# Patient Record
Sex: Female | Born: 2000 | Race: White | Hispanic: No | Marital: Single | Smoking: Never smoker
Health system: Southern US, Community
[De-identification: ages and names within clinical notes are randomized; demographics above are authoritative.]

## PROBLEM LIST (undated history)

## (undated) DIAGNOSIS — F909 Attention-deficit hyperactivity disorder, unspecified type: Secondary | ICD-10-CM

## (undated) DIAGNOSIS — F509 Eating disorder, unspecified: Secondary | ICD-10-CM

## (undated) DIAGNOSIS — R45851 Suicidal ideations: Secondary | ICD-10-CM

## (undated) DIAGNOSIS — F99 Mental disorder, not otherwise specified: Secondary | ICD-10-CM

## (undated) DIAGNOSIS — F32A Depression, unspecified: Secondary | ICD-10-CM

## (undated) DIAGNOSIS — F329 Major depressive disorder, single episode, unspecified: Secondary | ICD-10-CM

## (undated) DIAGNOSIS — F419 Anxiety disorder, unspecified: Secondary | ICD-10-CM

---
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
## 2020-07-16 ENCOUNTER — Encounter (HOSPITAL_COMMUNITY): Payer: Self-pay

## 2020-07-16 ENCOUNTER — Ambulatory Visit (HOSPITAL_COMMUNITY)
Admission: EM | Admit: 2020-07-16 | Discharge: 2020-07-16 | Disposition: A | Discharge status: Home / Self Care | Payer: Medicaid Other

## 2020-07-16 ENCOUNTER — Other Ambulatory Visit: Payer: Self-pay

## 2020-07-16 DIAGNOSIS — L0231 Cutaneous abscess of buttock: Secondary | ICD-10-CM

## 2020-07-16 MED ORDER — NAPROXEN 500 MG PO TABS: 500.0000 mg | ORAL_TABLET | Freq: Two times a day (BID) | ORAL | 0 refills | Status: DC

## 2020-07-16 MED ORDER — DOXYCYCLINE HYCLATE 100 MG PO CAPS: 100.0000 mg | ORAL_CAPSULE | Freq: Two times a day (BID) | ORAL | 0 refills | Status: AC

## 2020-07-16 NOTE — ED Provider Notes (Signed)
[facility]    CSN: [id number] Arrival date & time: 07/16/20  1550      History   Chief Complaint Chief Complaint  Patient presents with  . Abscess    HPI [name] is a 20 y.o. female.   Patient here with complaint of abscess to gluteal cleft that has been present for the past 2 days.  Patient reports pain has been worsening.  Denies any discharge or drainage.  Has not taken any OTC medications or treatments. Denies any specific alleviating or aggravating factors.  Denies any fevers, chest pain, shortness of breath, N/V/D, numbness, tingling, weakness, abdominal pain, or headaches.   ROS: As per HPI, all other pertinent ROS negative   The history is provided by the patient.  Abscess   Past Medical History:  Diagnosis Date  . ADHD (attention deficit hyperactivity disorder)   . Anxiety   . Depression   . Eating disorder   . Mental disorder   . Suicidal ideation     Patient Active Problem List   Diagnosis Date Noted  . ADHD (attention deficit hyperactivity disorder), combined type 12/14/2013  . Suicidal ideation 12/14/2013  . MDD (major depressive disorder), recurrent episode, severe (HCC) 12/14/2013  . Deliberate self-cutting 12/14/2013  . ODD (oppositional defiant disorder) 12/14/2013    History reviewed. No pertinent surgical history.  OB History   No obstetric history on file.      Home Medications    Prior to Admission medications   Medication Sig Start Date End Date Taking? Authorizing Provider  doxycycline (VIBRAMYCIN) 100 MG capsule Take 1 capsule (100 mg total) by mouth 2 (two) times daily for 10 days. 07/16/20 07/26/20 Yes Ivette Loyal, NP  naproxen (NAPROSYN) 500 MG tablet Take 1 tablet (500 mg total) by mouth 2 (two) times daily. 07/16/20  Yes Ivette Loyal, NP  traZODone (DESYREL) 50 MG tablet 1 tablet at bedtime as needed 02/15/16  Yes [provider]  cloNIDine (CATAPRES) 0.1 MG tablet Take 0.15 mg by mouth at  bedtime. 12/07/15   [provider]  escitalopram (LEXAPRO) 10 MG tablet Take 10 mg by mouth daily.    [provider]  FLUoxetine (PROZAC) 20 MG capsule Take 20 mg by mouth every morning. 07/01/20   [provider]  lisdexamfetamine (VYVANSE) 50 MG capsule Take 50 mg by mouth daily.    [provider]    Family History History reviewed. No pertinent family history.  Social History Social History   Tobacco Use  . Smoking status: Never Smoker  . Smokeless tobacco: Never Used  Substance Use Topics  . Alcohol use: No  . Drug use: No     Allergies   Patient has no known allergies.   Review of Systems Review of Systems  Skin: Positive for wound.  All other systems reviewed and are negative.    Physical Exam Triage Vital Signs ED Triage Vitals  Enc Vitals Group     BP 07/16/20 1615 112/78     Pulse Rate 07/16/20 1615 (!) 107     Resp 07/16/20 1615 18     Temp 07/16/20 1615 98.9 F (37.2 C)     Temp src --      SpO2 07/16/20 1615 98 %     Weight --      Height --      Head Circumference --      Peak Flow --      Pain Score 07/16/20 1613 8  Pain Loc --      Pain Edu? --      Excl. in GC? --    No data found.  Updated Vital Signs BP 112/78   Pulse (!) 107   Temp 98.9 F (37.2 C)   Resp 18   LMP 06/28/2020 (Approximate)   SpO2 98%   Visual Acuity Right Eye Distance:   Left Eye Distance:   Bilateral Distance:    Right Eye Near:   Left Eye Near:    Bilateral Near:     Physical Exam Vitals and nursing note reviewed.  Constitutional:      General: She is not in acute distress.    Appearance: Normal appearance. She is not ill-appearing, toxic-appearing or diaphoretic.  HENT:     Head: Normocephalic and atraumatic.  Eyes:     Conjunctiva/sclera: Conjunctivae normal.  Cardiovascular:     Rate and Rhythm: Normal rate.     Pulses: Normal pulses.  Pulmonary:     Effort: Pulmonary effort is normal.  Abdominal:      General: Abdomen is flat.  Musculoskeletal:        General: Normal range of motion.     Cervical back: Normal range of motion.  Skin:    General: Skin is warm and dry.     Findings: Abscess (fluctuant abscess to top of gluteal cleft) present.  Neurological:     General: No focal deficit present.     Mental Status: She is alert and oriented to person, place, and time.  Psychiatric:        Mood and Affect: Mood normal.      UC Treatments / Results  Labs (all labs ordered are listed, but only abnormal results are displayed) Labs Reviewed - No data to display  EKG   Radiology No results found.  Procedures Incision and Drainage  Date/Time: 07/16/2020 5:05 PM Performed by: Ivette Loyal, NP Authorized by: Ivette Loyal, NP   Consent:    Consent obtained:  Verbal   Consent given by:  Patient   Risks, benefits, and alternatives were discussed: yes     Risks discussed:  Bleeding, incomplete drainage, pain and infection   Alternatives discussed:  No treatment and alternative treatment Universal protocol:    Patient identity confirmed:  Verbally with patient and arm band Location:    Type:  Abscess   Location:  Anogenital   Anogenital location:  Gluteal cleft Pre-procedure details:    Skin preparation:  Antiseptic wash Procedure type:    Complexity:  Simple Procedure details:    Incision types:  Single straight   Incision depth:  Dermal   Wound management:  Probed and deloculated   Drainage:  Purulent   Drainage amount:  Moderate   Wound treatment:  Wound left open   Packing materials:  None Post-procedure details:    Procedure completion:  Tolerated well, no immediate complications   (including critical care time)  Medications Ordered in UC Medications - No data to display  Initial Impression / Assessment and Plan / UC Course  I have reviewed the triage vital signs and the nursing notes.  Pertinent labs & imaging results that were available during my care  of the patient were reviewed by me and considered in my medical decision making (see chart for details).     Assessment negative for red flags or concerns.  Abscess I&D as described above.  Wound cleaned and dressing applied.  Doxycycline twice daily for the next 10 days  for infection coverage.  Apply a warm compress at least 3-4 times a day to encourage drainage.  Clean wound with warm soapy water and apply new dressing at least twice a day.  Patient may take naproxen as needed for pain.  Discussed signs and symptoms of infection and encouraged to return or go to the emergency room for reevaluation if she develops any of those signs.  Follow-up with primary care as needed.  Final Clinical Impressions(s) / UC Diagnoses   Final diagnoses:  Abscess of buttock, right     Discharge Instructions     Take doxycycline 1 pill twice a day for the next 10 days.  Apply warm compress 3-4 times a day.  Clean the wound with warm soapy water and apply a new dressing at least twice a day.  You can take naproxen twice a day as needed for pain.  If you develop any redness, swelling, worsening pain, fevers, or red streaks follow-up or go to the emergency room for reevaluation.    ED Prescriptions    Medication Sig Dispense Auth. Provider   doxycycline (VIBRAMYCIN) 100 MG capsule Take 1 capsule (100 mg total) by mouth 2 (two) times daily for 10 days. 20 capsule Ivette Loyal, NP   naproxen (NAPROSYN) 500 MG tablet Take 1 tablet (500 mg total) by mouth 2 (two) times daily. 30 tablet Ivette Loyal, NP     PDMP not reviewed this encounter.   Ivette Loyal, NP 07/16/20 [phone]

## 2020-07-16 NOTE — Discharge Instructions (Addendum)
Take doxycycline 1 pill twice a day for the next 10 days.  Apply warm compress 3-4 times a day.  Clean the wound with warm soapy water and apply a new dressing at least twice a day.  You can take naproxen twice a day as needed for pain.  If you develop any redness, swelling, worsening pain, fevers, or red streaks follow-up or go to the emergency room for reevaluation.

## 2020-07-16 NOTE — ED Triage Notes (Signed)
Pt in with c/o abscess in between her buttocks that has been worsening over the last 2 days  Denies any drainage

## 2020-07-18 ENCOUNTER — Encounter (HOSPITAL_COMMUNITY): Payer: Self-pay | Admitting: Emergency Medicine

## 2020-07-18 ENCOUNTER — Emergency Department (HOSPITAL_COMMUNITY)
Admission: EM | Admit: 2020-07-18 | Discharge: 2020-07-18 | Disposition: A | Discharge status: Home / Self Care | Payer: Medicaid Other | Attending: Student | Admitting: Student

## 2020-07-18 ENCOUNTER — Other Ambulatory Visit: Payer: Self-pay

## 2020-07-18 DIAGNOSIS — L0501 Pilonidal cyst with abscess: Secondary | ICD-10-CM | POA: Insufficient documentation

## 2020-07-18 DIAGNOSIS — R1110 Vomiting, unspecified: Secondary | ICD-10-CM | POA: Insufficient documentation

## 2020-07-18 DIAGNOSIS — Z5321 Procedure and treatment not carried out due to patient leaving prior to being seen by health care provider: Secondary | ICD-10-CM | POA: Diagnosis not present

## 2020-07-18 LAB — CBC
HCT: 42.9 % (ref 36.0–46.0)
Hemoglobin: 14.5 g/dL (ref 12.0–15.0)
MCH: 32.1 pg (ref 26.0–34.0)
MCHC: 33.8 g/dL (ref 30.0–36.0)
MCV: 94.9 fL (ref 80.0–100.0)
Platelets: 287 10*3/uL (ref 150–400)
RBC: 4.52 MIL/uL (ref 3.87–5.11)
RDW: 12.6 % (ref 11.5–15.5)
WBC: 13.2 10*3/uL — ABNORMAL HIGH (ref 4.0–10.5)
nRBC: 0 % (ref 0.0–0.2)

## 2020-07-18 LAB — COMPREHENSIVE METABOLIC PANEL
ALT: 14 U/L (ref 0–44)
AST: 18 U/L (ref 15–41)
Albumin: 4.2 g/dL (ref 3.5–5.0)
Alkaline Phosphatase: 89 U/L (ref 38–126)
Anion gap: 10 (ref 5–15)
BUN: 8 mg/dL (ref 6–20)
CO2: 27 mmol/L (ref 22–32)
Calcium: 9.6 mg/dL (ref 8.9–10.3)
Chloride: 101 mmol/L (ref 98–111)
Creatinine, Ser: 0.84 mg/dL (ref 0.44–1.00)
GFR, Estimated: >60 mL/min (ref >60)
Glucose, Bld: 105 mg/dL — ABNORMAL HIGH (ref 70–99)
Potassium: 3.6 mmol/L (ref 3.5–5.1)
Sodium: 138 mmol/L (ref 135–145)
Total Bilirubin: 0.7 mg/dL (ref 0.3–1.2)
Total Protein: 7.6 g/dL (ref 6.5–8.1)

## 2020-07-18 LAB — MAGNESIUM: Magnesium: 1.9 mg/dL (ref 1.7–2.4)

## 2020-07-18 NOTE — ED Triage Notes (Signed)
Emergency Medicine Provider Triage Evaluation Note  [name] , a 20 y.o. female  was evaluated in triage.  Pt complains of pilondial abscess. States she had it drained two days ago, still having severe pain. Has been vomiting due to antibiotic, therefore has not been taking it.Taken 3 doses. Prescribed doxy.   Review of Systems  Positive: Abscess, vomiting  Negative: Fever  Physical Exam  LMP 06/28/2020 (Approximate)  Gen:   Awake, no distress   HEENT:  Atraumatic  Resp:  Normal effort  Cardiac:  Tachy to 140 Abd:   Pilonidal abscess  MSK:   Moves extremities without difficulty  Neuro:  Speech clear   Medical Decision Making  Medically screening exam initiated at 4:05 PM.  Appropriate orders placed.  [name] was informed that the remainder of the evaluation will be completed by another provider, this initial triage assessment does not replace that evaluation, and the importance of remaining in the ED until their evaluation is complete.  Clinical Impression  Tachycardic to 140, will obtain ekg. No CP.   MSE was initiated and I personally evaluated the patient and placed orders (if any) at  4:11 PM on July 18, 2020.  The patient appears stable so that the remainder of the MSE may be completed by another provider.    Farrel Gordon, PA-C 07/18/20 [phone]

## 2020-07-18 NOTE — ED Triage Notes (Signed)
Pt arrives to ED with c/o abscess to buttocks. Was seen at [facility] yesterday who performed I&D. Pt started antibiotics last night but comes to ED for worsening pain.

## 2020-07-18 NOTE — ED Notes (Signed)
Patient states it hurts too much and is leaving

## 2020-11-09 ENCOUNTER — Emergency Department (HOSPITAL_BASED_OUTPATIENT_CLINIC_OR_DEPARTMENT_OTHER)
Admission: EM | Admit: 2020-11-09 | Discharge: 2020-11-09 | Disposition: A | Discharge status: Home / Self Care | Payer: Medicaid Other | Attending: Emergency Medicine | Admitting: Emergency Medicine

## 2020-11-09 ENCOUNTER — Other Ambulatory Visit: Payer: Self-pay

## 2020-11-09 ENCOUNTER — Encounter (HOSPITAL_BASED_OUTPATIENT_CLINIC_OR_DEPARTMENT_OTHER): Payer: Self-pay | Admitting: Emergency Medicine

## 2020-11-09 ENCOUNTER — Emergency Department (HOSPITAL_BASED_OUTPATIENT_CLINIC_OR_DEPARTMENT_OTHER): Payer: Medicaid Other

## 2020-11-09 DIAGNOSIS — F17210 Nicotine dependence, cigarettes, uncomplicated: Secondary | ICD-10-CM | POA: Insufficient documentation

## 2020-11-09 DIAGNOSIS — R112 Nausea with vomiting, unspecified: Secondary | ICD-10-CM | POA: Diagnosis present

## 2020-11-09 DIAGNOSIS — R109 Unspecified abdominal pain: Secondary | ICD-10-CM | POA: Insufficient documentation

## 2020-11-09 DIAGNOSIS — N12 Tubulo-interstitial nephritis, not specified as acute or chronic: Secondary | ICD-10-CM | POA: Diagnosis not present

## 2020-11-09 LAB — COMPREHENSIVE METABOLIC PANEL
ALT: 15 U/L (ref 0–44)
AST: 15 U/L (ref 15–41)
Albumin: 4.1 g/dL (ref 3.5–5.0)
Alkaline Phosphatase: 65 U/L (ref 38–126)
Anion gap: 11 (ref 5–15)
BUN: 7 mg/dL (ref 6–20)
CO2: 29 mmol/L (ref 22–32)
Calcium: 8.7 mg/dL — ABNORMAL LOW (ref 8.9–10.3)
Chloride: 95 mmol/L — ABNORMAL LOW (ref 98–111)
Creatinine, Ser: 0.82 mg/dL (ref 0.44–1.00)
GFR, Estimated: >60 mL/min (ref >60)
Glucose, Bld: 102 mg/dL — ABNORMAL HIGH (ref 70–99)
Potassium: 2.7 mmol/L — CL (ref 3.5–5.1)
Sodium: 135 mmol/L (ref 135–145)
Total Bilirubin: 0.8 mg/dL (ref 0.3–1.2)
Total Protein: 7.5 g/dL (ref 6.5–8.1)

## 2020-11-09 LAB — CBC
HCT: 35.2 % — ABNORMAL LOW (ref 36.0–46.0)
Hemoglobin: 12.2 g/dL (ref 12.0–15.0)
MCH: 32.1 pg (ref 26.0–34.0)
MCHC: 34.7 g/dL (ref 30.0–36.0)
MCV: 92.6 fL (ref 80.0–100.0)
Platelets: 242 10*3/uL (ref 150–400)
RBC: 3.8 MIL/uL — ABNORMAL LOW (ref 3.87–5.11)
RDW: 12.5 % (ref 11.5–15.5)
WBC: 13.2 10*3/uL — ABNORMAL HIGH (ref 4.0–10.5)
nRBC: 0 % (ref 0.0–0.2)

## 2020-11-09 LAB — LIPASE, BLOOD: Lipase: <10 U/L — ABNORMAL LOW (ref 11–51)

## 2020-11-09 LAB — URINALYSIS, ROUTINE W REFLEX MICROSCOPIC
Bilirubin Urine: NEGATIVE
Glucose, UA: NEGATIVE mg/dL
Ketones, ur: NEGATIVE mg/dL
Nitrite: NEGATIVE
Protein, ur: 100 mg/dL — AB
Specific Gravity, Urine: 1.019 (ref 1.005–1.030)
pH: 6 (ref 5.0–8.0)

## 2020-11-09 LAB — PREGNANCY, URINE: Preg Test, Ur: NEGATIVE

## 2020-11-09 LAB — LACTIC ACID, PLASMA: Lactic Acid, Venous: 0.8 mmol/L (ref 0.5–1.9)

## 2020-11-09 MED ORDER — SODIUM CHLORIDE 0.9 % IV SOLN
2.0000 g | Freq: Once | INTRAVENOUS | Status: AC
  Administered 2020-11-09: 2 g via INTRAVENOUS
  Filled 2020-11-09: qty 20

## 2020-11-09 MED ORDER — POTASSIUM CHLORIDE CRYS ER 20 MEQ PO TBCR
40.0000 meq | EXTENDED_RELEASE_TABLET | Freq: Once | ORAL | Status: AC
  Administered 2020-11-09: 40 meq via ORAL
  Filled 2020-11-09: qty 2

## 2020-11-09 MED ORDER — LACTATED RINGERS IV BOLUS
1000.0000 mL | Freq: Once | INTRAVENOUS | Status: AC
  Administered 2020-11-09: 1000 mL via INTRAVENOUS

## 2020-11-09 MED ORDER — ONDANSETRON 4 MG PO TBDP: 4.0000 mg | ORAL_TABLET | ORAL | 0 refills | Status: AC | PRN

## 2020-11-09 MED ORDER — IOHEXOL 350 MG/ML SOLN
80.0000 mL | Freq: Once | INTRAVENOUS | Status: AC | PRN
  Administered 2020-11-09: 80 mL via INTRAVENOUS

## 2020-11-09 MED ORDER — IBUPROFEN 600 MG PO TABS: 600.0000 mg | ORAL_TABLET | Freq: Four times a day (QID) | ORAL | 0 refills | Status: AC | PRN

## 2020-11-09 MED ORDER — POTASSIUM CHLORIDE CRYS ER 20 MEQ PO TBCR: 20.0000 meq | EXTENDED_RELEASE_TABLET | Freq: Two times a day (BID) | ORAL | 0 refills | Status: AC

## 2020-11-09 MED ORDER — AMOXICILLIN-POT CLAVULANATE 875-125 MG PO TABS: 1.0000 | ORAL_TABLET | Freq: Two times a day (BID) | ORAL | 0 refills | Status: AC

## 2020-11-09 NOTE — Discharge Instructions (Addendum)
1.  You have been given an IV dose of Rocephin in the emergency department.  You do not need to start your antibiotics until tomorrow night (8\12 at around 10 PM).  You have been given a prescription for Zofran to take for nausea.  Take this as needed.  And continue to sip on fluids for the next 24 hours.  You can start to eat light food as tolerated when you are taking fluids.  Take extra strength Tylenol every 6 hours for fever control and body aches and pain.  You may also take ibuprofen 600 mg every 8 hours for additional pain and fever control. 2.  Schedule an appointment with your doctor for recheck within the next 3 to 5 days. 3.  Return to the emergency department if vomiting has not improved and you are not tolerating fluids within the next 12 hours or you have other worsening symptoms.

## 2020-11-09 NOTE — ED Triage Notes (Signed)
Left and middle abd pain x 1 week , hurts to cough and yawn,  has freq voiding x week also  nausea no diarrhea

## 2020-11-09 NOTE — ED Provider Notes (Signed)
[facility] EMERGENCY DEPT Provider Note   CSN: [id number] Arrival date & time: 11/09/20  1622     History Chief Complaint  Patient presents with   Abdominal Pain    [name] is a 20 y.o. female.  HPI Patient reports that symptoms started with vomiting 6 days ago.  Both the patient and her mother got acute onset of vomiting illness after eating together.  They assumed it was food poisoning.  Patient's mother reports she was better within 2 days.  Neither of them went on to have any diarrhea.  The patient however continued to have a lot of nausea and intermittent vomiting.  She reports her abdomen started to get very uncomfortable and painful.  She reports now she has pain in her left mid abdomen and urinary frequency.  She has had a fever at home.  She is currently on her menstrual cycle.  Patient is sexually active.  Patient has not had any vaginal drainage or discharge or lower abdominal pain.    Past Medical History:  Diagnosis Date   ADHD (attention deficit hyperactivity disorder)    Anxiety    Depression    Eating disorder    Mental disorder    Suicidal ideation     Patient Active Problem List   Diagnosis Date Noted   ADHD (attention deficit hyperactivity disorder), combined type 12/14/2013   Suicidal ideation 12/14/2013   MDD (major depressive disorder), recurrent episode, severe (HCC) 12/14/2013   Deliberate self-cutting 12/14/2013   ODD (oppositional defiant disorder) 12/14/2013    History reviewed. No pertinent surgical history.   OB History   No obstetric history on file.     No family history on file.  Social History   Tobacco Use   Smoking status: Every Day    Types: Cigarettes   Smokeless tobacco: Current  Vaping Use   Vaping Use: Every day  Substance Use Topics   Alcohol use: Yes   Drug use: Yes    Home Medications Prior to Admission medications   Medication Sig Start Date End Date Taking? Authorizing Provider   amoxicillin-clavulanate (AUGMENTIN) 875-125 MG tablet Take 1 tablet by mouth 2 (two) times daily. One po bid x 7 days 11/09/20  Yes Angeli Demilio, Lebron Conners, MD  ibuprofen (ADVIL) 600 MG tablet Take 1 tablet (600 mg total) by mouth every 6 (six) hours as needed. 11/09/20  Yes Arby Barrette, MD  ondansetron (ZOFRAN ODT) 4 MG disintegrating tablet Take 1 tablet (4 mg total) by mouth every 4 (four) hours as needed for nausea or vomiting. 11/09/20  Yes Bina Veenstra, Lebron Conners, MD  cloNIDine (CATAPRES) 0.1 MG tablet Take 0.15 mg by mouth at bedtime. 12/07/15   [provider]  escitalopram (LEXAPRO) 10 MG tablet Take 10 mg by mouth daily.    [provider]  FLUoxetine (PROZAC) 20 MG capsule Take 20 mg by mouth every morning. 07/01/20   [provider]  lisdexamfetamine (VYVANSE) 50 MG capsule Take 50 mg by mouth daily.    [provider]  naproxen (NAPROSYN) 500 MG tablet Take 1 tablet (500 mg total) by mouth 2 (two) times daily. 07/16/20   Ivette Loyal, NP  traZODone (DESYREL) 50 MG tablet 1 tablet at bedtime as needed 02/15/16   [provider]    Allergies    Patient has no known allergies.  Review of Systems   Review of Systems 10 systems reviewed and negative except as per HPI Physical Exam Updated Vital Signs BP (!) 105/59 (BP  Location: Right Arm)   Pulse 97   Temp 98.5 F (36.9 C)   Resp 20   Ht 5\' 2"  (1.575 m)   Wt 59.9 kg   LMP 10/26/2020   SpO2 99%   BMI 24.14 kg/m   Physical Exam Constitutional:      Comments: Patient appears mildly ill.  Mental status is clear.  No respiratory distress  HENT:     Mouth/Throat:     Pharynx: Oropharynx is clear.  Eyes:     Extraocular Movements: Extraocular movements intact.  Cardiovascular:     Rate and Rhythm: Normal rate and regular rhythm.  Pulmonary:     Effort: Pulmonary effort is normal.     Breath sounds: Normal breath sounds.  Abdominal:     Comments: Moderate mid left lateral quadrant pain to  palpation.  Lower abdomen nontender.  No right lower quadrant tenderness.  Musculoskeletal:        General: No swelling or tenderness. Normal range of motion.     Right lower leg: No edema.     Left lower leg: No edema.  Skin:    General: Skin is warm and dry.  Neurological:     General: No focal deficit present.     Mental Status: She is oriented to person, place, and time.     Coordination: Coordination normal.    ED Results / Procedures / Treatments   Labs (all labs ordered are listed, but only abnormal results are displayed) Labs Reviewed  LIPASE, BLOOD - Abnormal; Notable for the following components:      Result Value   Lipase <10 (*)    All other components within normal limits  COMPREHENSIVE METABOLIC PANEL - Abnormal; Notable for the following components:   Potassium 2.7 (*)    Chloride 95 (*)    Glucose, Bld 102 (*)    Calcium 8.7 (*)    All other components within normal limits  CBC - Abnormal; Notable for the following components:   WBC 13.2 (*)    RBC 3.80 (*)    HCT 35.2 (*)    All other components within normal limits  URINALYSIS, ROUTINE W REFLEX MICROSCOPIC - Abnormal; Notable for the following components:   Color, Urine BROWN (*)    APPearance CLOUDY (*)    Hgb urine dipstick LARGE (*)    Protein, ur 100 (*)    Leukocytes,Ua LARGE (*)    Bacteria, UA MANY (*)    All other components within normal limits  CULTURE, BLOOD (ROUTINE X 2)  CULTURE, BLOOD (ROUTINE X 2)  URINE CULTURE  PREGNANCY, URINE  LACTIC ACID, PLASMA  LACTIC ACID, PLASMA    EKG EKG Interpretation  Date/Time:  Thursday November 09 2020 18:04:42 EDT Ventricular Rate:  81 PR Interval:  165 QRS Duration: 84 QT Interval:  363 QTC Calculation: 422 R Axis:   76 Text Interpretation: Sinus rhythm Borderline T abnormalities, anterior leads rate slower but otherwise no sig change from previous Confirmed by Arby BarrettePfeiffer, Deicy Rusk [phone]([id number]) on 11/09/2020 9:46:18 PM  Radiology CT Abdomen Pelvis W  Contrast  Result Date: 11/09/2020 CLINICAL DATA:  Flank pain, stone disease suspected, left mid and abdominal flank pain for 1 week EXAM: CT ABDOMEN AND PELVIS WITH CONTRAST TECHNIQUE: Multidetector CT imaging of the abdomen and pelvis was performed using the standard protocol following bolus administration of intravenous contrast. CONTRAST:  80mL OMNIPAQUE IOHEXOL 350 MG/ML SOLN COMPARISON:  None. FINDINGS: Lower chest: Lung bases are clear. Normal heart size. No pericardial  effusion. Hepatobiliary: No worrisome focal liver lesions. Smooth liver surface contour. Normal hepatic attenuation. Normal gallbladder and biliary tree. Pancreas: No pancreatic ductal dilatation or surrounding inflammatory changes. Spleen: Normal in size. No concerning splenic lesions. Adrenals/Urinary Tract: Normal adrenals. Bilateral nephrographic striations, right greater than left. There is also more pronounced right perinephric and periureteral stranding albeit with bilateral urothelial thickening. No visible obstructing calculi. While the bladder itself is not of circumferentially thickened. No visible. No visible bladder calculi or layering debris. Stomach/Bowel: Distal esophagus, stomach and duodenal sweep are unremarkable. No small bowel wall thickening or dilatation. No evidence of obstruction. A normal appendix is visualized. No colonic dilatation or wall thickening. Vascular/Lymphatic: No significant vascular findings are present. No enlarged abdominal or pelvic lymph nodes. Reproductive: Anteverted uterus. Heterogeneous uterine enhancement is a normal physiologic finding related to contrast timing. Multiple follicles in both ovaries. Other: Small volume low-attenuation free fluid deep pelvis, nonspecific in a reproductive age female. Musculoskeletal: No acute osseous abnormality or suspicious osseous lesion. Dextrocurvature of the thoracolumbar spine, apex L2. IMPRESSION: Bilateral urothelial thickening most prominently about  the proximal ureters and renal collecting systems, right greater than left with bilateral nephrographic striations. Features are concerning for an ascending urinary tract infection with bilateral pyelonephritis. No obstructive urolithiasis or hydronephrosis. Electronically Signed   By: Kreg Shropshire M.D.   On: 11/09/2020 22:23    Procedures Procedures   Medications Ordered in ED Medications  potassium chloride SA (KLOR-CON) CR tablet 40 mEq (40 mEq Oral Given 11/09/20 1917)  lactated ringers bolus 1,000 mL (0 mLs Intravenous Stopped 11/09/20 2110)  cefTRIAXone (ROCEPHIN) 2 g in sodium chloride 0.9 % 100 mL IVPB (0 g Intravenous Stopped 11/09/20 2305)  iohexol (OMNIPAQUE) 350 MG/ML injection 80 mL (80 mLs Intravenous Contrast Given 11/09/20 2203)  potassium chloride SA (KLOR-CON) CR tablet 40 mEq (40 mEq Oral Given 11/09/20 2300)  lactated ringers bolus 1,000 mL (1,000 mLs Intravenous New Bag/Given 11/09/20 2300)    ED Course  I have reviewed the triage vital signs and the nursing notes.  Pertinent labs & imaging results that were available during my care of the patient were reviewed by me and considered in my medical decision making (see chart for details).    MDM Rules/Calculators/A&P                           Patient presents with a weeks worth of symptoms.  Urinalysis is positive for UTI.  CT scan does not show any evidence of retained kidney stone.  She has some symptoms of early pyelonephritis on CT.  Lactic acid negative.  Patient's mental status clear.  Patient has been rehydrated and given 2 g Rocephin.  Appearance is nontoxic.  At this time plan will be for continued home hydration with Zofran for the next 24 hours and then starting augmentin.  Return precautions reviewed. Final Clinical Impression(s) / ED Diagnoses Final diagnoses:  Pyelonephritis    Rx / DC Orders ED Discharge Orders          Ordered    amoxicillin-clavulanate (AUGMENTIN) 875-125 MG tablet  2 times daily         11/09/20 2324    ondansetron (ZOFRAN ODT) 4 MG disintegrating tablet  Every 4 hours PRN        11/09/20 2324    ibuprofen (ADVIL) 600 MG tablet  Every 6 hours PRN        11/09/20 2324  Arby Barrette, MD 11/09/20 2329

## 2020-11-12 LAB — URINE CULTURE: Culture: >=100000 — AB

## 2020-11-13 ENCOUNTER — Telehealth: Payer: Self-pay

## 2020-11-13 NOTE — Telephone Encounter (Signed)
Post ED Visit - Positive Culture Follow-up  Culture report reviewed by antimicrobial stewardship pharmacist: [facility] [x]  , Pharm.D. []  Audie Clear, Pharm.D., BCPS AQ-ID []  , Pharm.D., BCPS []  Celedonio Miyamoto, Pharm.D., BCPS []  [place], Garvin Fila.D., BCPS, AAHIVP []  , Pharm.D., BCPS, AAHIVP []  Georgina Pillion, PharmD, BCPS []  , PharmD, BCPS []  [place], PharmD, BCPS []  [address], PharmD []  , PharmD, BCPS []  Estella Husk, PharmD  Pharmacy Team []  Lysle Pearl, PharmD []  , PharmD []  Phillips Climes, PharmD []  , Rph []  Agapito Games) , PharmD []  Verlan Friends, PharmD []  , PharmD []  Mervyn Gay, PharmD []  , PharmD []  Vinnie Level, PharmD []  [facility], PharmD []  , PharmD []  Len Childs, PharmD   Positive urine culture Treated with Amoxicillin-Pot Clavulanate, organism sensitive to the same and no further patient follow-up is required at this time.  11/13/2020, 11:37 AM

## 2020-11-14 LAB — CULTURE, BLOOD (ROUTINE X 2)
Culture: NO GROWTH
Culture: NO GROWTH
Special Requests: ADEQUATE
Special Requests: ADEQUATE

## 2021-08-31 ENCOUNTER — Encounter (HOSPITAL_BASED_OUTPATIENT_CLINIC_OR_DEPARTMENT_OTHER): Payer: Self-pay | Admitting: *Deleted

## 2021-08-31 ENCOUNTER — Emergency Department (HOSPITAL_BASED_OUTPATIENT_CLINIC_OR_DEPARTMENT_OTHER): Payer: Medicaid Other

## 2021-08-31 ENCOUNTER — Emergency Department (HOSPITAL_BASED_OUTPATIENT_CLINIC_OR_DEPARTMENT_OTHER)
Admission: EM | Admit: 2021-08-31 | Discharge: 2021-08-31 | Disposition: A | Discharge status: Home / Self Care | Payer: Medicaid Other | Attending: Emergency Medicine | Admitting: Emergency Medicine

## 2021-08-31 ENCOUNTER — Other Ambulatory Visit: Payer: Self-pay

## 2021-08-31 DIAGNOSIS — R112 Nausea with vomiting, unspecified: Secondary | ICD-10-CM | POA: Diagnosis present

## 2021-08-31 DIAGNOSIS — R000 Tachycardia, unspecified: Secondary | ICD-10-CM | POA: Insufficient documentation

## 2021-08-31 DIAGNOSIS — R109 Unspecified abdominal pain: Secondary | ICD-10-CM

## 2021-08-31 DIAGNOSIS — N12 Tubulo-interstitial nephritis, not specified as acute or chronic: Secondary | ICD-10-CM | POA: Diagnosis not present

## 2021-08-31 LAB — URINALYSIS, ROUTINE W REFLEX MICROSCOPIC
Bilirubin Urine: NEGATIVE
Glucose, UA: NEGATIVE mg/dL
Nitrite: NEGATIVE
Protein, ur: 30 mg/dL — AB
Specific Gravity, Urine: 1.013 (ref 1.005–1.030)
pH: 6.5 (ref 5.0–8.0)

## 2021-08-31 LAB — PREGNANCY, URINE: Preg Test, Ur: NEGATIVE

## 2021-08-31 MED ORDER — FENTANYL CITRATE PF 50 MCG/ML IJ SOSY
50.0000 ug | PREFILLED_SYRINGE | Freq: Once | INTRAMUSCULAR | Status: AC
  Administered 2021-08-31: 50 ug via INTRAVENOUS
  Filled 2021-08-31: qty 1

## 2021-08-31 MED ORDER — SODIUM CHLORIDE 0.9 % IV BOLUS
1000.0000 mL | Freq: Once | INTRAVENOUS | Status: AC
  Administered 2021-08-31: 1000 mL via INTRAVENOUS

## 2021-08-31 MED ORDER — CIPROFLOXACIN HCL 500 MG PO TABS: 500.0000 mg | ORAL_TABLET | Freq: Two times a day (BID) | ORAL | 0 refills | Status: AC

## 2021-08-31 MED ORDER — PROMETHAZINE HCL 25 MG PO TABS: 25.0000 mg | ORAL_TABLET | Freq: Four times a day (QID) | ORAL | 0 refills | Status: AC | PRN

## 2021-08-31 MED ORDER — ONDANSETRON HCL 4 MG/2ML IJ SOLN
4.0000 mg | Freq: Once | INTRAMUSCULAR | Status: AC
  Administered 2021-08-31: 4 mg via INTRAVENOUS
  Filled 2021-08-31: qty 2

## 2021-08-31 NOTE — Discharge Instructions (Addendum)
You were seen today for a kidney infection.  I have prescribed an antibiotic to take care of the infection.  If you feel to improve or if conditions worsen, please seek further care, this would include if you become unable to tolerate oral fluids

## 2021-08-31 NOTE — ED Provider Notes (Signed)
[facility] EMERGENCY DEPT Provider Note   CSN: [id number] Arrival date & time: 08/31/21  1923     History  Chief Complaint  Patient presents with   Back Pain    [name] is a 21 y.o. female.  Patient presents with 2 to 3 days of low back pain with nausea and vomiting.  Patient denies urinary symptoms at this time.  Patient states that she had pyelonephritis in the past and this feels the same as that presentation.  The patient denies abdominal pain, chest pain, shortness of breath, dysuria.  Endorses back pain, nausea, vomiting.  Past medical history significant for history of pyelonephritis, depression, ADHD, anxiety, suicidal ideation  HPI     Home Medications Prior to Admission medications   Medication Sig Start Date End Date Taking? Authorizing Provider  ciprofloxacin (CIPRO) 500 MG tablet Take 1 tablet (500 mg total) by mouth every 12 (twelve) hours. 08/31/21  Yes Darrick Grinder, PA-C  promethazine (PHENERGAN) 25 MG tablet Take 1 tablet (25 mg total) by mouth every 6 (six) hours as needed for nausea or vomiting. 08/31/21  Yes Barrie Dunker B, PA-C  amoxicillin-clavulanate (AUGMENTIN) 875-125 MG tablet Take 1 tablet by mouth 2 (two) times daily. One po bid x 7 days 11/09/20   Arby Barrette, MD  cloNIDine (CATAPRES) 0.1 MG tablet Take 0.15 mg by mouth at bedtime. 12/07/15   [provider]  escitalopram (LEXAPRO) 10 MG tablet Take 10 mg by mouth daily.    [provider]  FLUoxetine (PROZAC) 20 MG capsule Take 20 mg by mouth every morning. 07/01/20   [provider]  ibuprofen (ADVIL) 600 MG tablet Take 1 tablet (600 mg total) by mouth every 6 (six) hours as needed. 11/09/20   Arby Barrette, MD  lisdexamfetamine (VYVANSE) 50 MG capsule Take 50 mg by mouth daily.    [provider]  naproxen (NAPROSYN) 500 MG tablet Take 1 tablet (500 mg total) by mouth 2 (two) times daily. 07/16/20   Ivette Loyal, NP  ondansetron (ZOFRAN  ODT) 4 MG disintegrating tablet Take 1 tablet (4 mg total) by mouth every 4 (four) hours as needed for nausea or vomiting. 11/09/20   Arby Barrette, MD  potassium chloride SA (KLOR-CON) 20 MEQ tablet Take 1 tablet (20 mEq total) by mouth 2 (two) times daily. 11/09/20   Arby Barrette, MD  traZODone (DESYREL) 50 MG tablet 1 tablet at bedtime as needed 02/15/16   [provider]      Allergies    Patient has no known allergies.    Review of Systems   Review of Systems  Respiratory:  Negative for shortness of breath.   Cardiovascular:  Negative for chest pain.  Gastrointestinal:  Positive for nausea and vomiting. Negative for abdominal pain.  Genitourinary:  Positive for flank pain.  Skin:  Negative for pallor.   Physical Exam Updated Vital Signs BP 115/76   Pulse 93   Temp 98.8 F (37.1 C)   Resp 18   Wt 59 kg   LMP 08/21/2021 (Approximate)   SpO2 97%   BMI 23.78 kg/m  Physical Exam Vitals and nursing note reviewed.  Constitutional:      Appearance: She is normal weight. She is ill-appearing.  HENT:     Head: Normocephalic and atraumatic.     Mouth/Throat:     Mouth: Mucous membranes are moist.  Eyes:     Conjunctiva/sclera: Conjunctivae normal.  Cardiovascular:     Rate and Rhythm: Regular  rhythm. Tachycardia present.     Pulses: Normal pulses.     Heart sounds: Normal heart sounds.  Pulmonary:     Effort: Pulmonary effort is normal.     Breath sounds: Normal breath sounds.  Abdominal:     Palpations: Abdomen is soft.     Tenderness: There is no abdominal tenderness. There is right CVA tenderness and left CVA tenderness.  Musculoskeletal:     Cervical back: Normal range of motion and neck supple.  Skin:    General: Skin is warm and dry.     Capillary Refill: Capillary refill takes less than 2 seconds.  Neurological:     Mental Status: She is alert.    ED Results / Procedures / Treatments   Labs (all labs ordered are listed, but only abnormal results  are displayed) Labs Reviewed  URINALYSIS, ROUTINE W REFLEX MICROSCOPIC - Abnormal; Notable for the following components:      Result Value   APPearance HAZY (*)    Hgb urine dipstick SMALL (*)    Ketones, ur TRACE (*)    Protein, ur 30 (*)    Leukocytes,Ua LARGE (*)    Bacteria, UA FEW (*)    All other components within normal limits  PREGNANCY, URINE    EKG None  Radiology CT Renal Stone Study  Result Date: 08/31/2021 CLINICAL DATA:  Lower back pain. EXAM: CT ABDOMEN AND PELVIS WITHOUT CONTRAST TECHNIQUE: Multidetector CT imaging of the abdomen and pelvis was performed following the standard protocol without IV contrast. RADIATION DOSE REDUCTION: This exam was performed according to the departmental dose-optimization program which includes automated exposure control, adjustment of the mA and/or kV according to patient size and/or use of iterative reconstruction technique. COMPARISON:  November 09, 2020 FINDINGS: Lower chest: No acute abnormality. Hepatobiliary: No focal liver abnormality is seen. No gallstones, gallbladder wall thickening, or biliary dilatation. Pancreas: Unremarkable. No pancreatic ductal dilatation or surrounding inflammatory changes. Spleen: Normal in size without focal abnormality. Adrenals/Urinary Tract: Adrenal glands are unremarkable. Kidneys are normal, without renal calculi, focal lesion, or hydronephrosis. Bladder is unremarkable. Stomach/Bowel: Stomach is within normal limits. Appendix appears normal. No evidence of bowel wall thickening, distention, or inflammatory changes. Vascular/Lymphatic: No significant vascular findings are present. No enlarged abdominal or pelvic lymph nodes. Reproductive: Uterus and bilateral adnexa are unremarkable. Other: No abdominal wall hernia or abnormality. A small amount of low-attenuation fluid (approximately 15.37 Hounsfield units) is again seen within the posterior aspect of the lower pelvis. This is along the regions posterior to the  uterus and bilateral adnexa and seen on the prior exam. Musculoskeletal: No acute or significant osseous findings. IMPRESSION: 1. Small amount of posterior pelvic fluid which is nonspecific in nature and stable in appearance when compared to the prior study. Electronically Signed   By: Aram Candelahaddeus  Houston M.D.   On: 08/31/2021 21:18    Procedures Procedures    Medications Ordered in ED Medications  ondansetron [facility](ZOFRAN) injection 4 mg (4 mg Intravenous Given 08/31/21 2128)  fentaNYL (SUBLIMAZE) injection 50 mcg (50 mcg Intravenous Given 08/31/21 2125)  sodium chloride 0.9 % bolus 1,000 mL (1,000 mLs Intravenous New Bag/Given 08/31/21 2127)    ED Course/ Medical Decision Making/ A&P                           Medical Decision Making Amount and/or Complexity of Data Reviewed Labs: ordered. Radiology: ordered.   This patient presents to the ED for concern of  back pain with nausea and vomiting, this involves an extensive number of treatment options, and is a complaint that carries with it a high risk of complications and morbidity.  The differential diagnosis includes pyelonephritis, nephrolithiasis, cystitis, and others   Co morbidities that complicate the patient evaluation  History of pyelonephritis   Additional history obtained:   External records from outside source obtained and reviewed including hospital records showing pyelonephritis and August 2022   Lab Tests:  I Ordered, and personally interpreted labs.  The pertinent results include: Urinalysis with hazy appearance, small hemoglobin on urine dipstick, trace ketones, 30 protein, large leukocytes, few bacteria, pregnancy urine negative   Imaging Studies ordered:  I ordered imaging studies including CT renal stone study I independently visualized and interpreted imaging which showed small amount of posterior pelvic fluid which is nonspecific in nature and stable in appearance when compared to the prior study I agree with the  radiologist interpretation   Problem List / ED Course / Critical interventions / Medication management   I ordered medication including Zofran for nausea, fentanyl for pain Reevaluation of the patient after these medicines showed that the patient improved I have reviewed the patients home medicines and have made adjustments as needed  Test / Admission - Considered:  Based on urinalysis and patient history, this seems likely to be a developing pyelonephritis.  We will treat the patient with Cipro.  I will also prescribe Phenergan as the patient states that she does not tolerate oral Zofran.  The patient has been given return precautions including the inability to tolerate p.o. fluids.        Final Clinical Impression(s) / ED Diagnoses Final diagnoses:  Nausea and vomiting, unspecified vomiting type  Flank pain  Pyelonephritis    Rx / DC Orders ED Discharge Orders          Ordered    ciprofloxacin (CIPRO) 500 MG tablet  Every 12 hours        08/31/21 2143    promethazine (PHENERGAN) 25 MG tablet  Every 6 hours PRN        08/31/21 2143              Pamala Duffel 08/31/21 2145    Terrilee Files, MD 09/01/21 [phone]

## 2021-08-31 NOTE — ED Notes (Signed)
Pt given urine cup and advised that urine sample is needed, pt voided pta and states that she needs a bit before she can void.

## 2021-08-31 NOTE — ED Triage Notes (Signed)
Pt is here for lower back pain and she feels that this feels the same as when she had a kidney infection in the past. Pt reports that this has been getting worse and initially began 3-4 days ago.

## 2022-11-17 IMAGING — CT CT RENAL STONE PROTOCOL
2 of 4 series · 16 of 46 positions shown, 18 images · non-contrast
Comparison: November 09, 2020

CLINICAL DATA: Lower back pain.



[Series 2: stone full · axial · 0.61mm/px · z∈[-668,-268]mm · 13 of 88 slices shown, 15 images]
[im 4/88  soft-tissue]
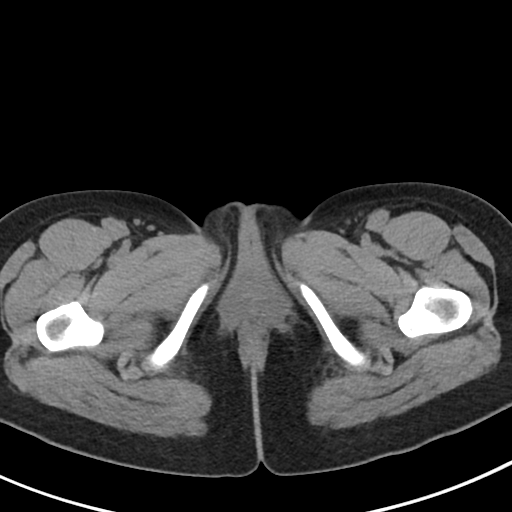
[im 4/88  bone]
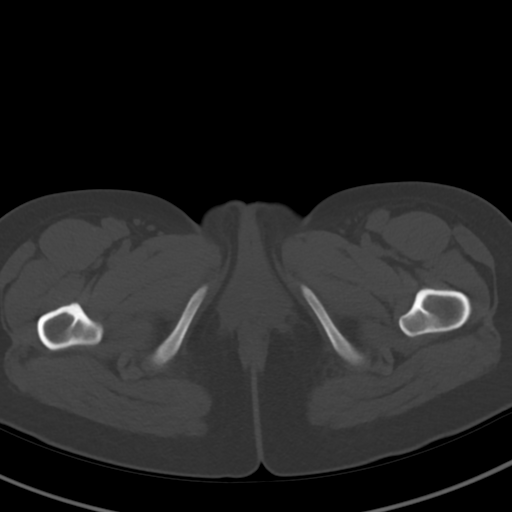
[im 11/88  soft-tissue]
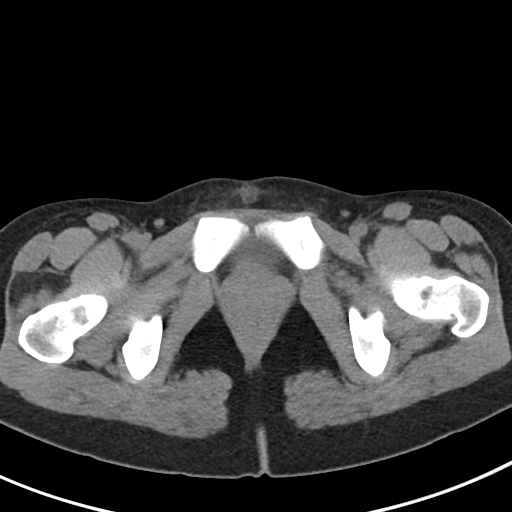
[im 18/88  soft-tissue]
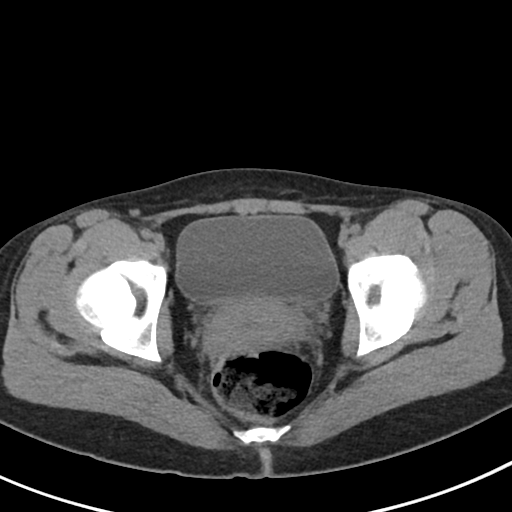
[im 25/88  soft-tissue]
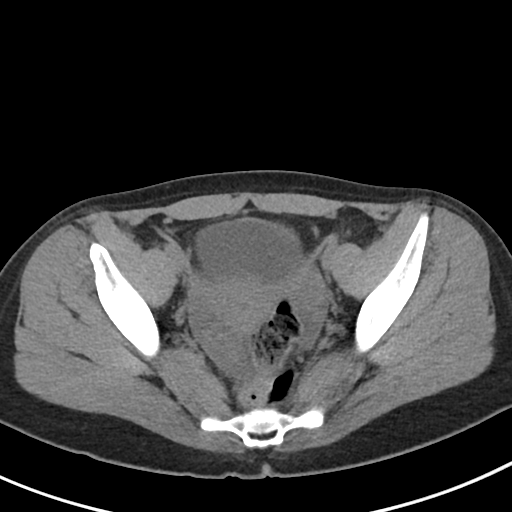
[im 32/88  soft-tissue]
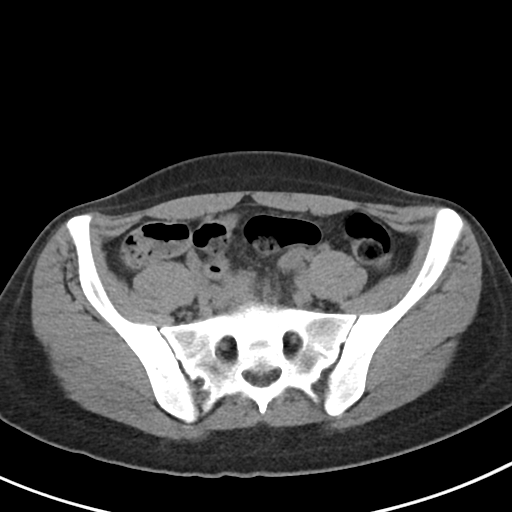
[im 39/88  soft-tissue]
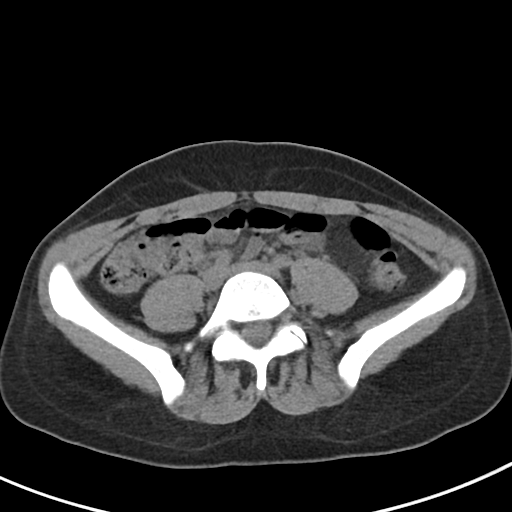
[im 46/88  soft-tissue]
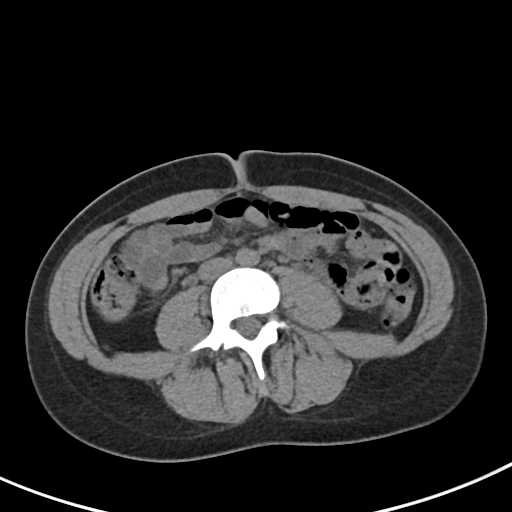
[im 49/88  soft-tissue]
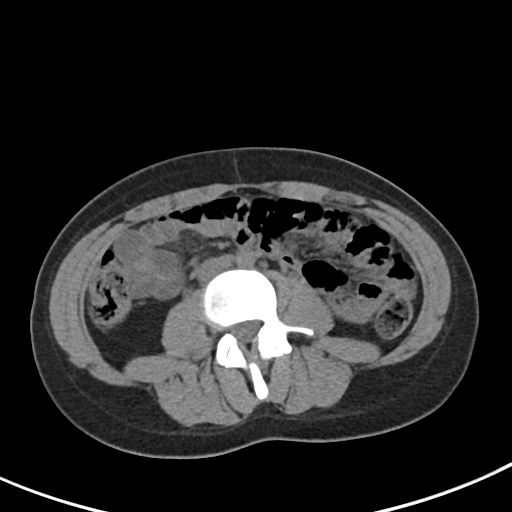
[im 56/88  soft-tissue]
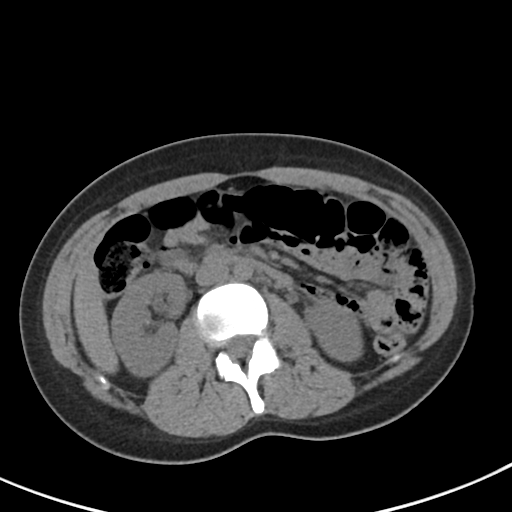
[im 56/88  bone]
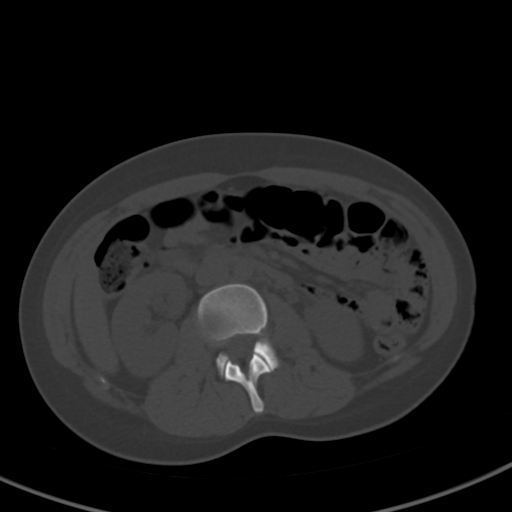
[im 63/88  soft-tissue]
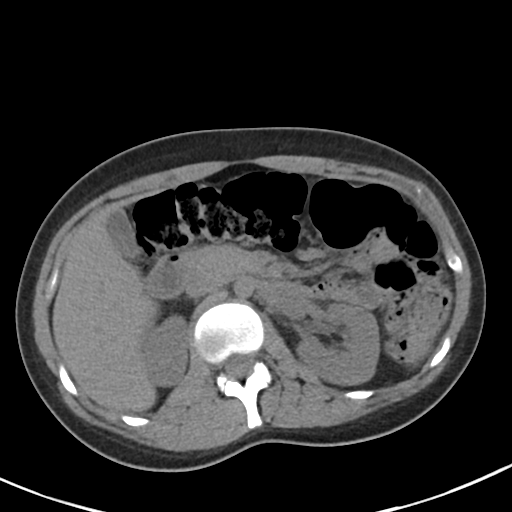
[im 70/88  soft-tissue]
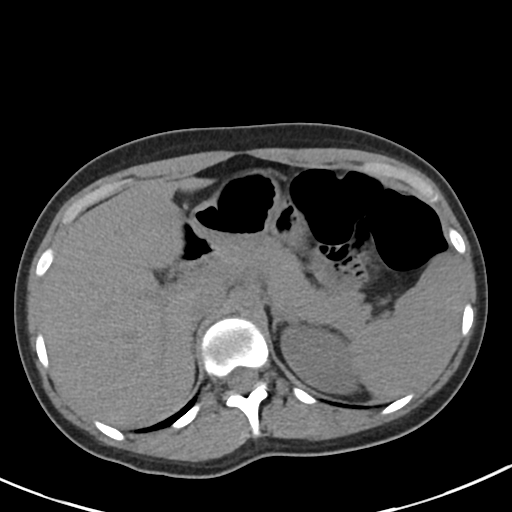
[im 77/88  soft-tissue]
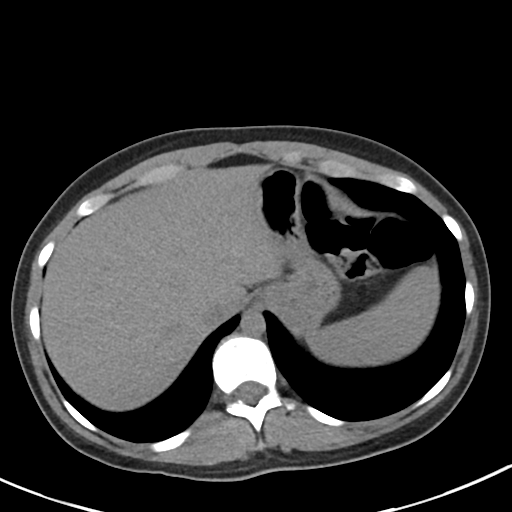
[im 84/88  soft-tissue]
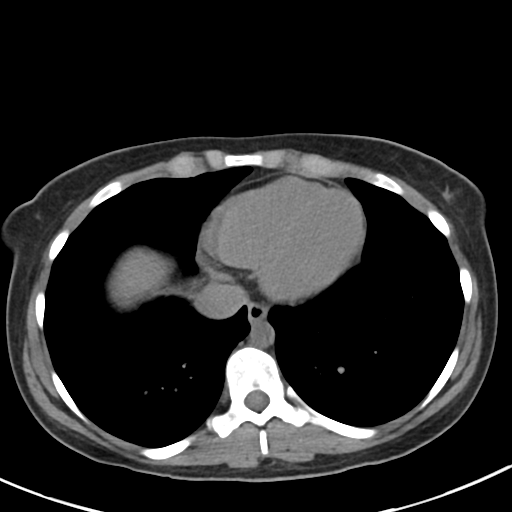

[Series 5: coronal · coronal · 0.66mm/px · 3 of 74 slices shown]
[im 25/74  soft-tissue]
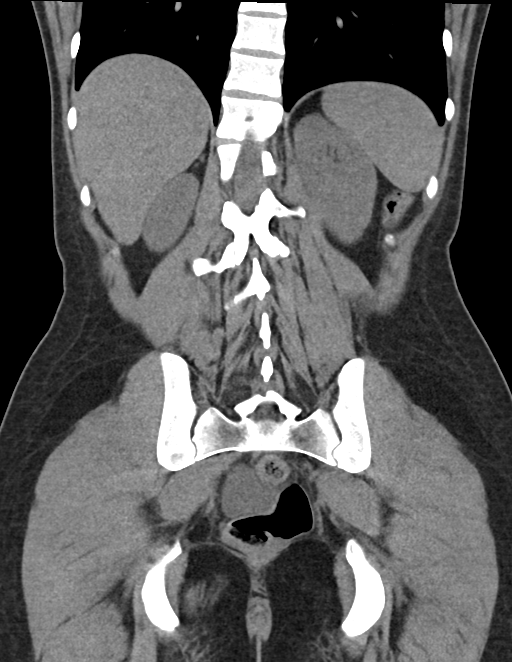
[im 33/74  soft-tissue]
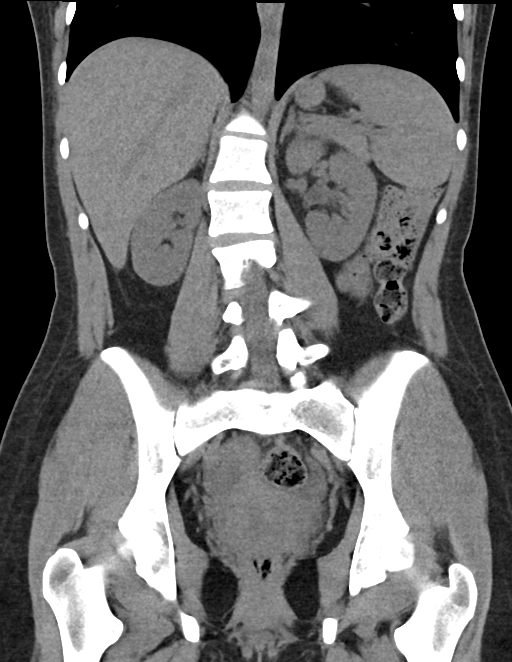
[im 41/74  soft-tissue]
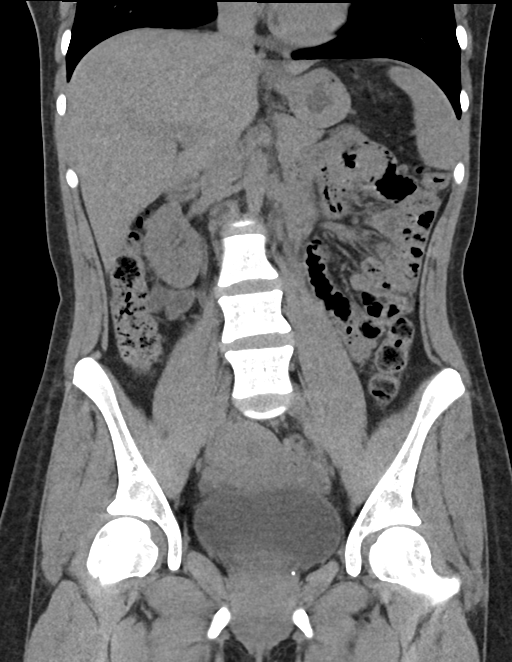

[16 of 46 positions shown; findings below may reference images not displayed]

FINDINGS: Lower chest: No acute abnormality.

Hepatobiliary: No focal liver abnormality is seen. No gallstones,
gallbladder wall thickening, or biliary dilatation.

Pancreas: Unremarkable. No pancreatic ductal dilatation or
surrounding inflammatory changes.

Spleen: Normal in size without focal abnormality.

Adrenals/Urinary Tract: Adrenal glands are unremarkable. Kidneys are
normal, without renal calculi, focal lesion, or hydronephrosis.
Bladder is unremarkable.

Stomach/Bowel: Stomach is within normal limits. Appendix appears
normal. No evidence of bowel wall thickening, distention, or
inflammatory changes.

Vascular/Lymphatic: No significant vascular findings are present. No
enlarged abdominal or pelvic lymph nodes.

Reproductive: Uterus and bilateral adnexa are unremarkable.

Other: No abdominal wall hernia or abnormality. A small amount of
low-attenuation fluid (approximately 15.37 Hounsfield units) is
again seen within the posterior aspect of the lower pelvis. This is
along the regions posterior to the uterus and bilateral adnexa and
seen on the prior exam.

Musculoskeletal: No acute or significant osseous findings.
IMPRESSION: 1. Small amount of posterior pelvic fluid which is nonspecific in
nature and stable in appearance when compared to the prior study.

## 2024-02-16 ENCOUNTER — Ambulatory Visit
Admission: EM | Admit: 2024-02-16 | Discharge: 2024-02-16 | Disposition: A | Discharge status: Home / Self Care | Payer: MEDICAID | Attending: Urgent Care | Admitting: Urgent Care

## 2024-02-16 DIAGNOSIS — R102 Pelvic and perineal pain unspecified side: Secondary | ICD-10-CM

## 2024-02-16 LAB — POCT URINE DIPSTICK
Bilirubin, UA: NEGATIVE
Blood, UA: NEGATIVE
Glucose, UA: NEGATIVE mg/dL
Ketones, POC UA: NEGATIVE mg/dL
Leukocytes, UA: NEGATIVE
Nitrite, UA: NEGATIVE
POC PROTEIN,UA: NEGATIVE
Spec Grav, UA: >=1.03 — AB (ref 1.010–1.025)
Urobilinogen, UA: 0.2 U/dL
pH, UA: 6 (ref 5.0–8.0)

## 2024-02-16 LAB — POCT URINE PREGNANCY: Preg Test, Ur: NEGATIVE

## 2024-02-16 MED ORDER — NAPROXEN 500 MG PO TABS: 500.0000 mg | ORAL_TABLET | Freq: Two times a day (BID) | ORAL | 0 refills | Status: AC

## 2024-02-16 NOTE — ED Provider Notes (Signed)
 [facility]  Note:  This document was prepared using Conservation officer, historic buildings and may include unintentional dictation errors.  MRN: [id number] DOB: [birth year]  Subjective:   [name] is a 23 y.o. female presenting for 1 hour history of persistent suprapubic pain, developing nausea. Denies fever, vomiting, abdominal pain, rashes, dysuria, urinary frequency, hematuria, vaginal discharge, diarrhea, constipation.  LMP was 2 weeks ago. No OCP. No concern for STI. Has concerns about ovarian cysts because her mother has them as well.  Has never been diagnosed with them herself.  Does not have an OB/GYN.  No current facility-administered medications for this encounter.  Current Outpatient Medications:    amoxicillin -clavulanate (AUGMENTIN ) 875-125 MG tablet, Take 1 tablet by mouth 2 (two) times daily. One po bid x 7 days, Disp: 14 tablet, Rfl: 0   ciprofloxacin  (CIPRO ) 500 MG tablet, Take 1 tablet (500 mg total) by mouth every 12 (twelve) hours., Disp: 10 tablet, Rfl: 0   cloNIDine  (CATAPRES ) 0.1 MG tablet, Take 0.15 mg by mouth at bedtime., Disp: , Rfl: 2   escitalopram (LEXAPRO) 10 MG tablet, Take 10 mg by mouth daily., Disp: , Rfl:    FLUoxetine (PROZAC) 20 MG capsule, Take 20 mg by mouth every morning., Disp: , Rfl:    ibuprofen  (ADVIL ) 600 MG tablet, Take 1 tablet (600 mg total) by mouth every 6 (six) hours as needed., Disp: 30 tablet, Rfl: 0   lisdexamfetamine (VYVANSE) 50 MG capsule, Take 50 mg by mouth daily., Disp: , Rfl:    naproxen  (NAPROSYN ) 500 MG tablet, Take 1 tablet (500 mg total) by mouth 2 (two) times daily., Disp: 30 tablet, Rfl: 0   ondansetron  (ZOFRAN  ODT) 4 MG disintegrating tablet, Take 1 tablet (4 mg total) by mouth every 4 (four) hours as needed for nausea or vomiting., Disp: 20 tablet, Rfl: 0   potassium chloride  SA (KLOR-CON ) 20 MEQ tablet, Take 1 tablet (20 mEq total) by mouth 2 (two) times daily., Disp: 8 tablet, Rfl: 0    promethazine  (PHENERGAN ) 25 MG tablet, Take 1 tablet (25 mg total) by mouth every 6 (six) hours as needed for nausea or vomiting., Disp: 30 tablet, Rfl: 0   traZODone (DESYREL) 50 MG tablet, 1 tablet at bedtime as needed, Disp: , Rfl:    No Known Allergies  Past Medical History:  Diagnosis Date   ADHD (attention deficit hyperactivity disorder)    Anxiety    Depression    Eating disorder    Mental disorder    Suicidal ideation      History reviewed. No pertinent surgical history.  History reviewed. No pertinent family history.  Social History   Tobacco Use   Smoking status: Never   Smokeless tobacco: Never  Vaping Use   Vaping status: Some Days  Substance Use Topics   Alcohol use: Yes    Comment: Occa   Drug use: Yes    Frequency: 2.0 times per week    Types: Marijuana    ROS   Objective:   Vitals: BP 113/72 (BP Location: Left Arm)   Pulse 74   Temp 98.2 F (36.8 C) (Oral)   Resp 16   LMP  (Within Weeks) Comment: 2 weeks  SpO2 98%   Physical Exam Constitutional:      General: She is not in acute distress.    Appearance: Normal appearance. She is well-developed. She is not ill-appearing, toxic-appearing or diaphoretic.  HENT:     Head: Normocephalic and atraumatic.  Nose: Nose normal.     Mouth/Throat:     Mouth: Mucous membranes are moist.     Pharynx: Oropharynx is clear.  Eyes:     General: No scleral icterus.       Right eye: No discharge.        Left eye: No discharge.     Extraocular Movements: Extraocular movements intact.     Conjunctiva/sclera: Conjunctivae normal.  Cardiovascular:     Rate and Rhythm: Normal rate.  Pulmonary:     Effort: Pulmonary effort is normal.  Abdominal:     General: Bowel sounds are normal. There is no distension.     Palpations: Abdomen is soft. There is no mass.     Tenderness: There is abdominal tenderness (mild) in the suprapubic area. There is no right CVA tenderness, left CVA tenderness, guarding or  rebound.  Skin:    General: Skin is warm and dry.  Neurological:     General: No focal deficit present.     Mental Status: She is alert and oriented to person, place, and time.  Psychiatric:        Mood and Affect: Mood normal.        Behavior: Behavior normal.        Thought Content: Thought content normal.        Judgment: Judgment normal.     Results for orders placed or performed during the hospital encounter of 02/16/24 (from the past 24 hours)  POCT URINE DIPSTICK     Status: Abnormal   Collection Time: 02/16/24 10:56 AM  Result Value Ref Range   Color, UA yellow yellow   Clarity, UA clear clear   Glucose, UA negative negative mg/dL   Bilirubin, UA negative negative   Ketones, POC UA negative negative mg/dL   Spec Grav, UA >=8.969 (A) 1.010 - 1.025   Blood, UA negative negative   pH, UA 6.0 5.0 - 8.0   POC PROTEIN,UA negative negative, trace   Urobilinogen, UA 0.2 0.2 or 1.0 E.U./dL   Nitrite, UA Negative Negative   Leukocytes, UA Negative Negative  POCT urine pregnancy     Status: None   Collection Time: 02/16/24 10:56 AM  Result Value Ref Range   Preg Test, Ur Negative Negative    Assessment and Plan :   PDMP not reviewed this encounter.  1. Acute pelvic pain, female    Will defer urine culture as she has no urinary symptoms.  Declined STI testing.  Patient is hemodynamically stable, low suspicion for PID, ovarian torsion, acute gynecologic emergency.  In clinic, her pain is improving.  Recommended she pursue follow-up with an OB/GYN.  Use naproxen  for pain and inflammation.  Counseled patient on potential for adverse effects with medications prescribed/recommended today, ER and return-to-clinic precautions discussed, patient verbalized understanding.    Christopher Savannah, PA-C 02/16/24 1114

## 2024-02-16 NOTE — ED Triage Notes (Signed)
 Pt reports uterus pain started 1 hr ago. States happened also 3-4 days ago. Concern for ovarian as runs in her family. Pot has not taken any meds for complaints.

## 2024-02-23 ENCOUNTER — Other Ambulatory Visit: Payer: Self-pay

## 2024-02-23 DIAGNOSIS — N83209 Unspecified ovarian cyst, unspecified side: Secondary | ICD-10-CM

## 2024-02-24 ENCOUNTER — Ambulatory Visit
Admission: RE | Admit: 2024-02-24 | Discharge: 2024-02-24 | Disposition: A | Discharge status: Home / Self Care | Payer: MEDICAID | Source: Ambulatory Visit

## 2024-02-24 DIAGNOSIS — N83209 Unspecified ovarian cyst, unspecified side: Secondary | ICD-10-CM

## 2024-03-04 ENCOUNTER — Other Ambulatory Visit: Payer: MEDICAID
